# Patient Record
Sex: Male | Born: 2000 | Race: Black or African American | Hispanic: No | Marital: Single | Smoking: Never smoker
Health system: Southern US, Community
[De-identification: ages and names within clinical notes are randomized; demographics above are authoritative.]

---
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
## 2022-05-01 IMAGING — CR DG CHEST 2V
2 series · 3 of 3 positions shown · non-contrast
Comparison: None.

CLINICAL DATA: Chest pain and heart palpitations.

EXAM:
CHEST - 2 VIEW

[Series 2: chest lat · 0.14mm/px · 2 of 2 slices shown]
[im 1/2]
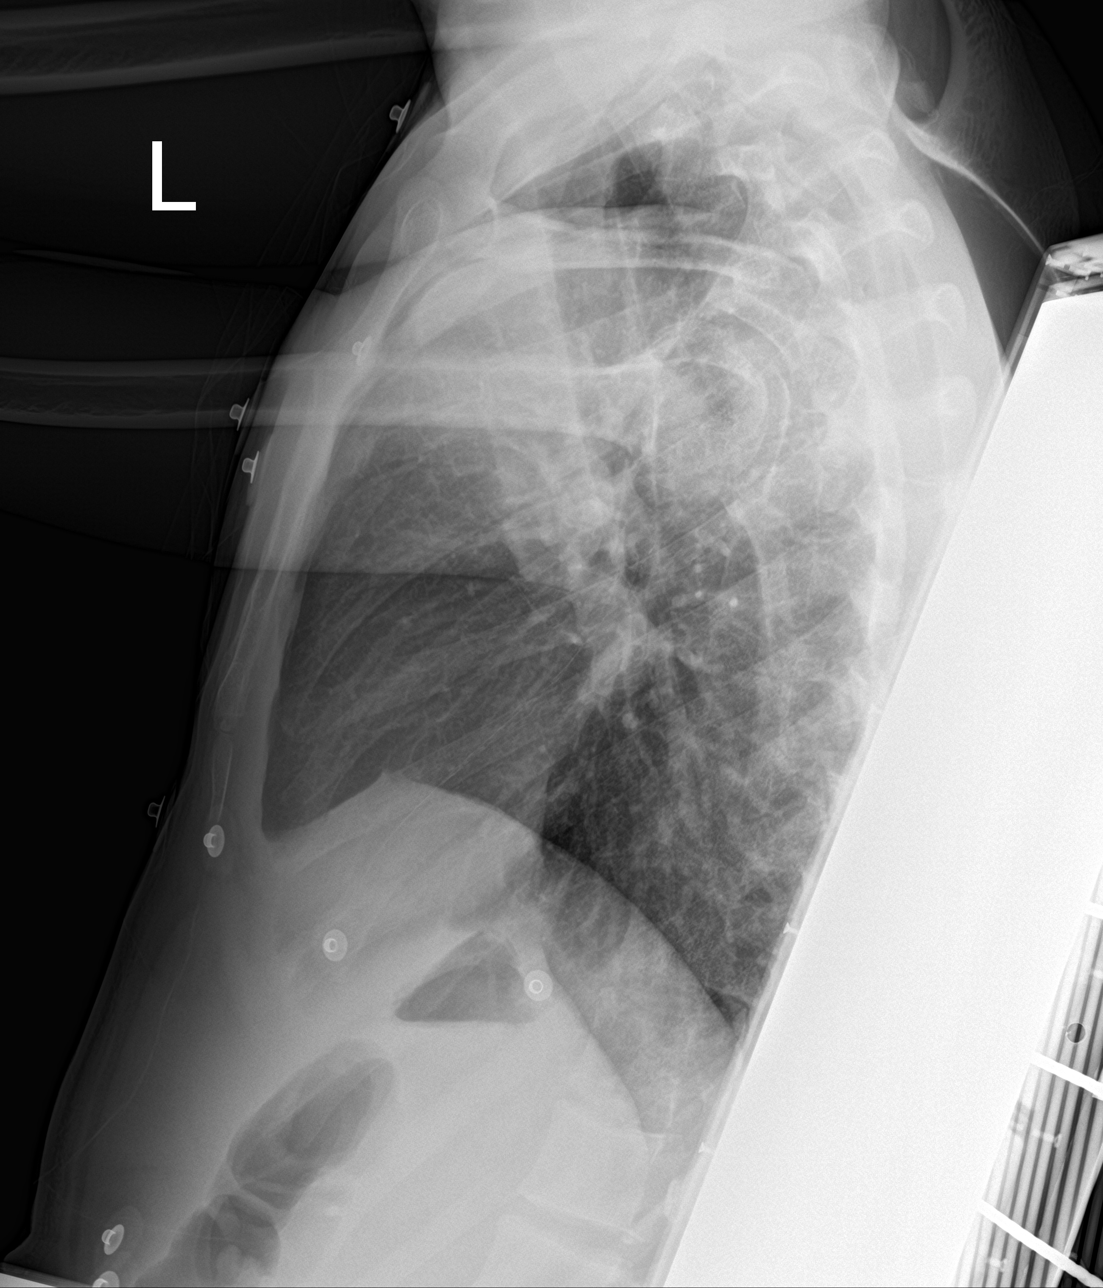
[im 2/2]
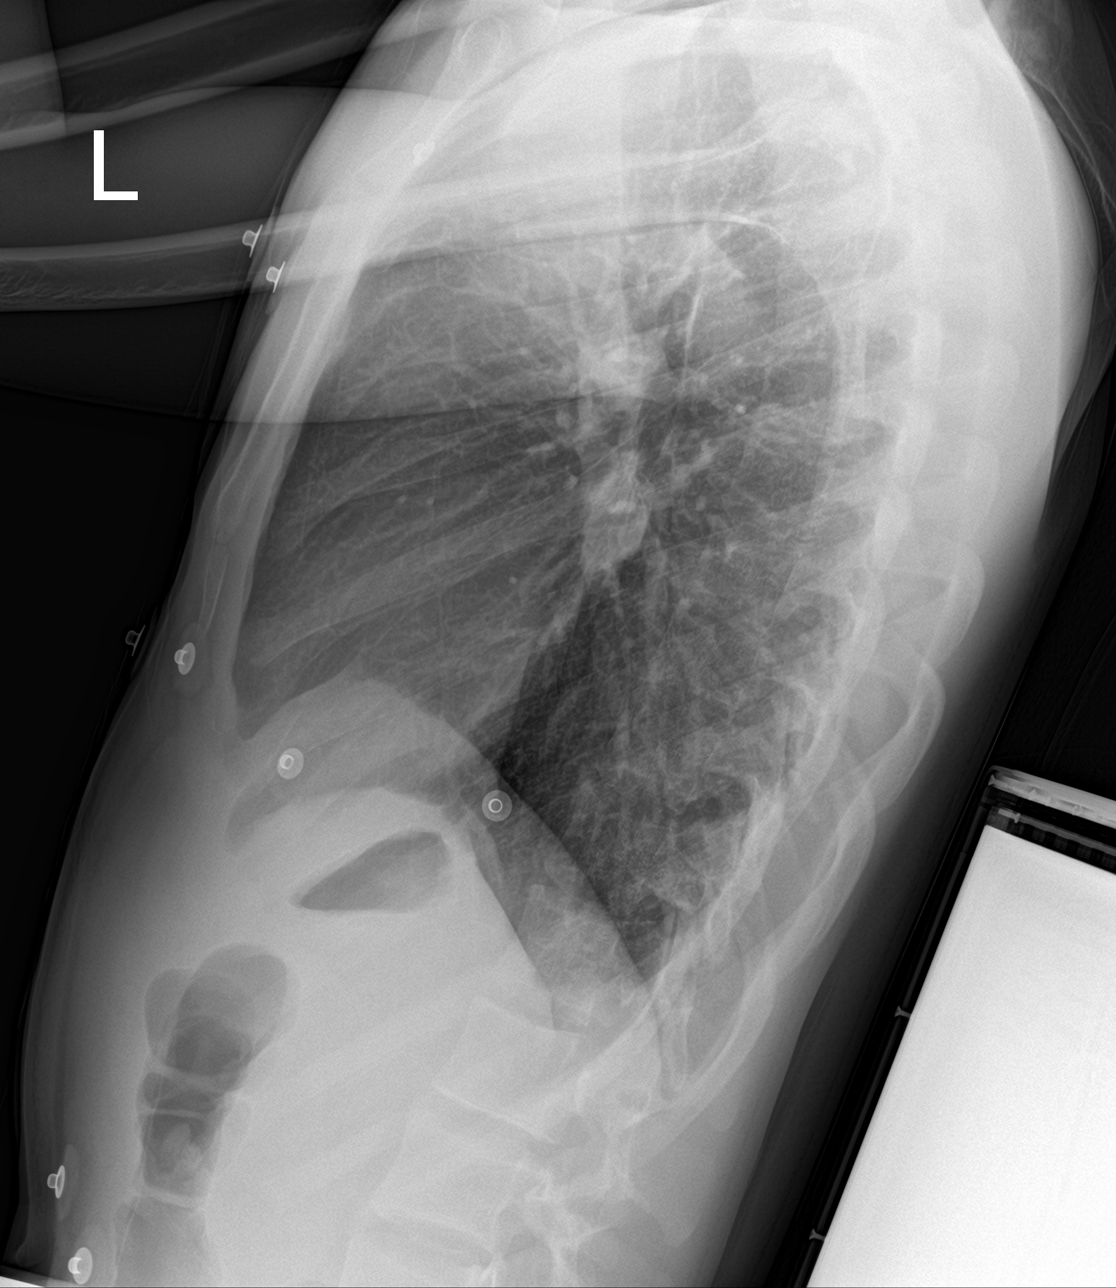

[chest ap]
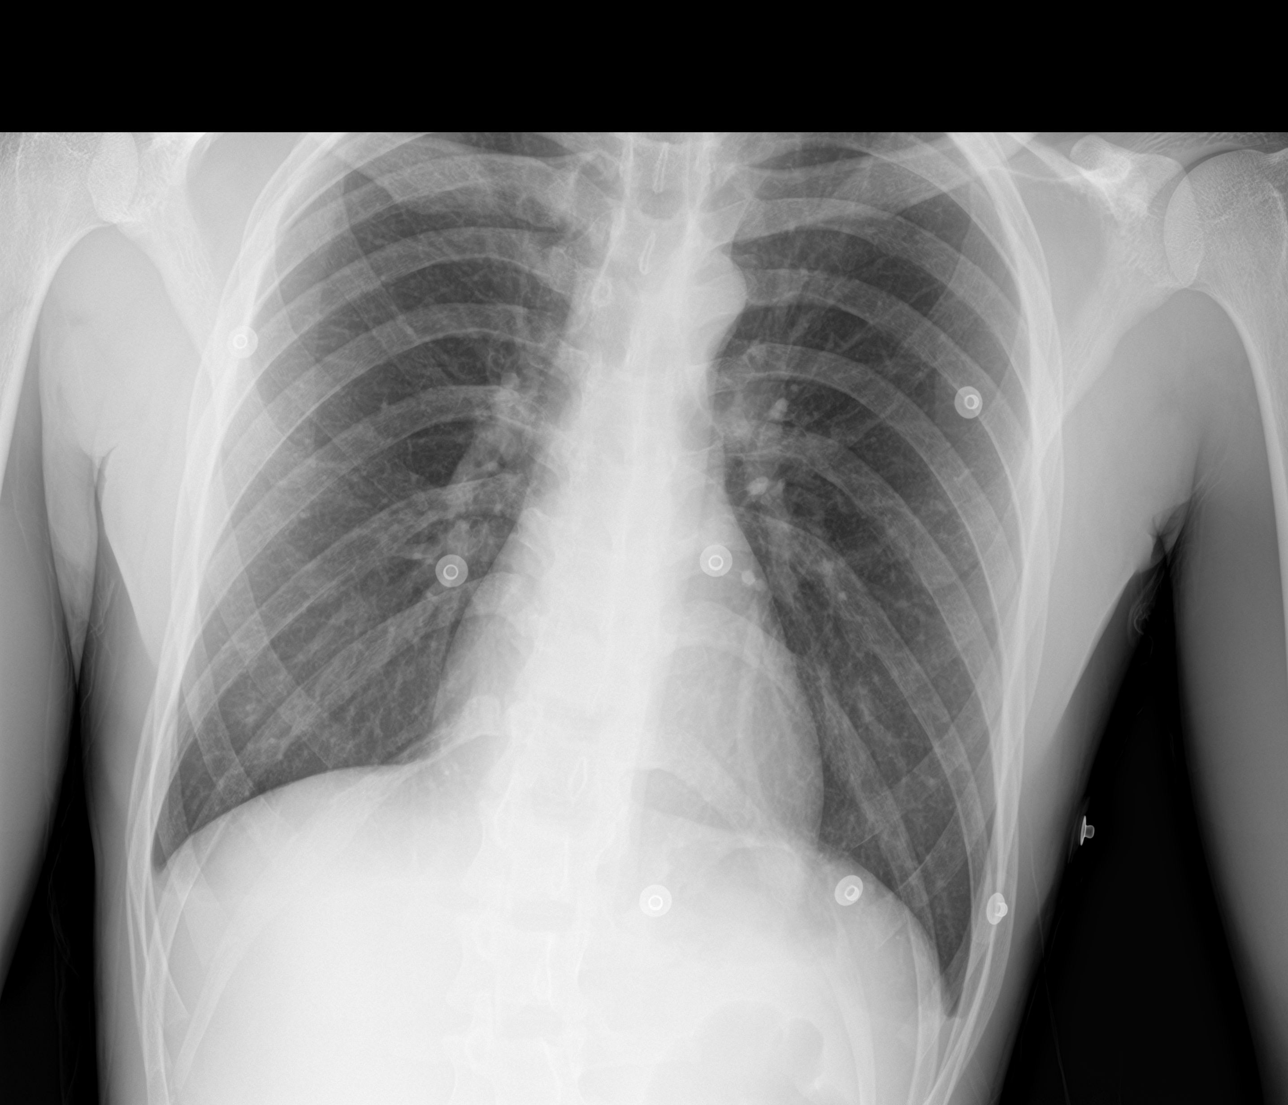

[3 of 3 positions shown; findings below may reference images not displayed]

FINDINGS: The heart size and mediastinal contours are within normal limits.
Both lungs are clear. The visualized skeletal structures are
unremarkable.
IMPRESSION: No active cardiopulmonary disease.

## 2024-04-13 ENCOUNTER — Ambulatory Visit: Payer: Self-pay | Admitting: Nurse Practitioner
